# Patient Record
Sex: Female | Born: 1965 | Race: White | Hispanic: No | Marital: Married | Smoking: Never smoker
Health system: Southern US, Community
[De-identification: ages and names within clinical notes are randomized; demographics above are authoritative.]

## PROBLEM LIST (undated history)

## (undated) DIAGNOSIS — F419 Anxiety disorder, unspecified: Secondary | ICD-10-CM

## (undated) DIAGNOSIS — Z87898 Personal history of other specified conditions: Secondary | ICD-10-CM

## (undated) HISTORY — PX: BRAIN TUMOR EXCISION: SHX577

## (undated) HISTORY — PX: CLEFT PALATE REPAIR: SUR1165

## (undated) HISTORY — PX: CERVICAL LAMINECTOMY: SHX94

---

## 2012-09-16 ENCOUNTER — Emergency Department: Payer: Self-pay | Admitting: Emergency Medicine

## 2012-09-16 LAB — CBC WITH DIFFERENTIAL/PLATELET
Basophil %: 0.3 %
Eosinophil %: 1.4 %
Lymphocyte #: 2.1 10*3/uL (ref 1.0–3.6)
Lymphocyte %: 17.5 %
MCH: 29.9 pg (ref 26.0–34.0)
MCHC: 34.4 g/dL (ref 32.0–36.0)
MCV: 87 fL (ref 80–100)
Monocyte #: 0.4 x10 3/mm (ref 0.2–0.9)
Neutrophil #: 9.3 10*3/uL — ABNORMAL HIGH (ref 1.4–6.5)
Neutrophil %: 77.3 %
RDW: 12.9 % (ref 11.5–14.5)

## 2012-09-16 LAB — URINALYSIS, COMPLETE
Bacteria: NONE SEEN
Bilirubin,UR: NEGATIVE
Glucose,UR: NEGATIVE mg/dL (ref 0–75)
Ketone: NEGATIVE
Ph: 6 (ref 4.5–8.0)
Specific Gravity: 1.021 (ref 1.003–1.030)
Squamous Epithelial: 1
WBC UR: 2 /HPF (ref 0–5)

## 2012-09-16 LAB — COMPREHENSIVE METABOLIC PANEL
Anion Gap: 10 (ref 7–16)
Calcium, Total: 9.1 mg/dL (ref 8.5–10.1)
Chloride: 105 mmol/L (ref 98–107)
Co2: 25 mmol/L (ref 21–32)
Creatinine: 0.71 mg/dL (ref 0.60–1.30)
EGFR (African American): >60
EGFR (Non-African Amer.): >60
Potassium: 3.3 mmol/L — ABNORMAL LOW (ref 3.5–5.1)
SGOT(AST): 48 U/L — ABNORMAL HIGH (ref 15–37)
SGPT (ALT): 49 U/L (ref 12–78)

## 2012-09-16 LAB — PROTIME-INR
INR: 0.9
Prothrombin Time: 12.7 secs (ref 11.5–14.7)

## 2012-09-16 LAB — HCG, QUANTITATIVE, PREGNANCY: Beta Hcg, Quant.: <1 m[IU]/mL — ABNORMAL LOW
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
## 2016-11-12 ENCOUNTER — Ambulatory Visit
Admission: EM | Admit: 2016-11-12 | Discharge: 2016-11-12 | Disposition: A | Discharge status: Home / Self Care | Payer: 59 | Attending: Family Medicine | Admitting: Family Medicine

## 2016-11-12 DIAGNOSIS — J0100 Acute maxillary sinusitis, unspecified: Secondary | ICD-10-CM

## 2016-11-12 DIAGNOSIS — R05 Cough: Secondary | ICD-10-CM | POA: Diagnosis not present

## 2016-11-12 DIAGNOSIS — R059 Cough, unspecified: Secondary | ICD-10-CM

## 2016-11-12 HISTORY — DX: Personal history of other specified conditions: Z87.898

## 2016-11-12 HISTORY — DX: Anxiety disorder, unspecified: F41.9

## 2016-11-12 MED ORDER — AMOXICILLIN 875 MG PO TABS: 875.0000 mg | ORAL_TABLET | Freq: Two times a day (BID) | ORAL | 0 refills | Status: AC

## 2016-11-12 MED ORDER — GUAIFENESIN-CODEINE 100-10 MG/5ML PO SOLN: ORAL | 0 refills | Status: AC
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
## 2016-11-12 NOTE — ED Triage Notes (Signed)
Patient complains of sinus pain and pressure, cough with chest congestion. Patient states that symptoms have been worsening for 5 days. Patient reports that she did have a fever of 102 last night. Patient states that she has been taking dayquil and nyquil without relief.

## 2020-09-11 ENCOUNTER — Other Ambulatory Visit: Payer: Self-pay | Admitting: Unknown Physician Specialty

## 2020-09-11 ENCOUNTER — Telehealth: Payer: Self-pay | Admitting: Unknown Physician Specialty

## 2020-09-11 DIAGNOSIS — E663 Overweight: Secondary | ICD-10-CM

## 2020-09-11 DIAGNOSIS — U071 COVID-19: Secondary | ICD-10-CM

## 2020-09-11 NOTE — Telephone Encounter (Signed)
I connected by phone with Renee Pineda on 09/11/2020 at 11:10 AM to discuss the potential use of a new treatment for mild to moderate COVID-19 viral infection in non-hospitalized patients.  This patient is a 54 y.o. female that meets the FDA criteria for Emergency Use Authorization of COVID monoclonal antibody casirivimab/imdevimab.  Has a (+) direct SARS-CoV-2 viral test result  Has mild or moderate COVID-19   Is NOT hospitalized due to COVID-19  Is within 10 days of symptom onset  Has at least one of the high risk factor(s) for progression to severe COVID-19 and/or hospitalization as defined in EUA.  Specific high risk criteria : BMI > 25   I have spoken and communicated the following to the patient or parent/caregiver regarding COVID monoclonal antibody treatment:  1. FDA has authorized the emergency use for the treatment of mild to moderate COVID-19 in adults and pediatric patients with positive results of direct SARS-CoV-2 viral testing who are 22 years of age and older weighing at least 40 kg, and who are at high risk for progressing to severe COVID-19 and/or hospitalization.  2. The significant known and potential risks and benefits of COVID monoclonal antibody, and the extent to which such potential risks and benefits are unknown.  3. Information on available alternative treatments and the risks and benefits of those alternatives, including clinical trials.  4. Patients treated with COVID monoclonal antibody should continue to self-isolate and use infection control measures (e.g., wear mask, isolate, social distance, avoid sharing personal items, clean and disinfect "high touch" surfaces, and frequent handwashing) according to CDC guidelines.   5. The patient or parent/caregiver has the option to accept or refuse COVID monoclonal antibody treatment.  After reviewing this information with the patient, The patient agreed to proceed with receiving casirivimab\imdevimab infusion  and will be provided a copy of the Fact sheet prior to receiving the infusion. [name] 09/11/2020 11:10 AM  Sx onset 9/18

## 2020-09-12 ENCOUNTER — Ambulatory Visit (HOSPITAL_COMMUNITY)
Admission: RE | Admit: 2020-09-12 | Discharge: 2020-09-12 | Disposition: A | Discharge status: Home / Self Care | Payer: Managed Care, Other (non HMO) | Source: Ambulatory Visit | Attending: Pulmonary Disease | Admitting: Pulmonary Disease

## 2020-09-12 ENCOUNTER — Other Ambulatory Visit (HOSPITAL_COMMUNITY): Payer: Self-pay

## 2020-09-12 DIAGNOSIS — U071 COVID-19: Secondary | ICD-10-CM | POA: Diagnosis present

## 2020-09-12 DIAGNOSIS — E663 Overweight: Secondary | ICD-10-CM

## 2020-09-12 MED ORDER — EPINEPHRINE 0.3 MG/0.3ML IJ SOAJ: 0.3000 mg | Freq: Once | INTRAMUSCULAR | Status: DC | PRN

## 2020-09-12 MED ORDER — METHYLPREDNISOLONE SODIUM SUCC 125 MG IJ SOLR: 125.0000 mg | Freq: Once | INTRAMUSCULAR | Status: DC | PRN

## 2020-09-12 MED ORDER — SODIUM CHLORIDE 0.9 % IV SOLN
1200.0000 mg | Freq: Once | INTRAVENOUS | Status: AC
  Administered 2020-09-12: 1200 mg via INTRAVENOUS

## 2020-09-12 MED ORDER — ALBUTEROL SULFATE HFA 108 (90 BASE) MCG/ACT IN AERS: 2.0000 | INHALATION_SPRAY | Freq: Once | RESPIRATORY_TRACT | Status: DC | PRN

## 2020-09-12 MED ORDER — DIPHENHYDRAMINE HCL 50 MG/ML IJ SOLN: 50.0000 mg | Freq: Once | INTRAMUSCULAR | Status: DC | PRN

## 2020-09-12 MED ORDER — FAMOTIDINE IN NACL 20-0.9 MG/50ML-% IV SOLN: 20.0000 mg | Freq: Once | INTRAVENOUS | Status: DC | PRN

## 2020-09-12 MED ORDER — SODIUM CHLORIDE 0.9 % IV SOLN: INTRAVENOUS | Status: DC | PRN

## 2020-09-12 NOTE — Discharge Instructions (Signed)

## 2020-09-12 NOTE — Progress Notes (Signed)
  Diagnosis: COVID-19  Physician: Dr. Delford Field  Procedure: [facility] Med: casirivimab\imdevimab infusion - Provided patient with casirivimab\imdevimab fact sheet for patients, parents and caregivers prior to infusion.  Complications: No immediate complications noted.  Discharge: Discharged home   Sharlie Shreffler Mliss Sax 09/12/2020
